# Patient Record
Sex: Female | Born: 1985 | Race: Black or African American | Hispanic: No | Marital: Married | Smoking: Never smoker
Health system: Southern US, Community
[De-identification: ages and names within clinical notes are randomized; demographics above are authoritative.]

---

## 2017-03-08 ENCOUNTER — Encounter (HOSPITAL_COMMUNITY): Payer: Self-pay

## 2017-03-08 ENCOUNTER — Emergency Department (HOSPITAL_COMMUNITY): Payer: BLUE CROSS/BLUE SHIELD

## 2017-03-08 ENCOUNTER — Emergency Department (HOSPITAL_COMMUNITY)
Admission: EM | Admit: 2017-03-08 | Discharge: 2017-03-09 | Disposition: A | Discharge status: Home / Self Care | Payer: BLUE CROSS/BLUE SHIELD | Attending: Emergency Medicine | Admitting: Emergency Medicine

## 2017-03-08 DIAGNOSIS — R079 Chest pain, unspecified: Secondary | ICD-10-CM | POA: Diagnosis present

## 2017-03-08 DIAGNOSIS — R0789 Other chest pain: Secondary | ICD-10-CM | POA: Diagnosis not present

## 2017-03-08 LAB — CBC
HCT: 36.5 % (ref 36.0–46.0)
HEMOGLOBIN: 11.8 g/dL — AB (ref 12.0–15.0)
MCH: 27.1 pg (ref 26.0–34.0)
MCHC: 32.3 g/dL (ref 30.0–36.0)
MCV: 83.7 fL (ref 78.0–100.0)
PLATELETS: 211 10*3/uL (ref 150–400)
RBC: 4.36 MIL/uL (ref 3.87–5.11)
RDW: 13.9 % (ref 11.5–15.5)
WBC: 3.9 10*3/uL — ABNORMAL LOW (ref 4.0–10.5)

## 2017-03-08 LAB — BASIC METABOLIC PANEL
Anion gap: 8 (ref 5–15)
BUN: 8 mg/dL (ref 6–20)
CALCIUM: 9.1 mg/dL (ref 8.9–10.3)
CHLORIDE: 105 mmol/L (ref 101–111)
CO2: 21 mmol/L — AB (ref 22–32)
CREATININE: 0.7 mg/dL (ref 0.44–1.00)
GFR calc non Af Amer: >60 mL/min (ref >60)
GLUCOSE: 94 mg/dL (ref 65–99)
Potassium: 3.5 mmol/L (ref 3.5–5.1)
Sodium: 134 mmol/L — ABNORMAL LOW (ref 135–145)

## 2017-03-08 LAB — I-STAT BETA HCG BLOOD, ED (MC, WL, AP ONLY)

## 2017-03-08 LAB — I-STAT TROPONIN, ED
TROPONIN I, POC: 0 ng/mL (ref 0.00–0.08)
Troponin i, poc: 0 ng/mL (ref 0.00–0.08)

## 2017-03-08 MED ORDER — GI COCKTAIL ~~LOC~~
30.0000 mL | Freq: Once | ORAL | Status: AC
  Administered 2017-03-08: 30 mL via ORAL
  Filled 2017-03-08: qty 30

## 2017-03-08 NOTE — ED Triage Notes (Signed)
patient complains of sharp chest pain that started this am when she awoke. Reports that the pain resolved than returned this afternoon. No shortness of breath. Denies cold symptoms. NAD

## 2017-03-08 NOTE — ED Provider Notes (Signed)
[facility]-EMERGENCY DEPT Provider Note   CSN: [id number] Arrival date & time: 03/08/17  1832     History   Chief Complaint Chief Complaint  Patient presents with  . Chest Pain    HPI Rose Ayala is a 31 y.o. female.  HPI   31 year old female, nonsmoker presenting for evaluation of chest pain. patient woke up this morning with pain to her mid sternal region which has been persistent, mildly improved during lunchtime but it has returned. She described pain as a sharp sensation, worse with positional change all with taking deep breath. Pain is currently 6 out of 10. No specific treatment tried. No associated fever, chills, lightheadedness, dizziness, nausea, vomiting, diarrhea, shortness of breath, productive cough, back pain, abdominal pain. No similar in family history of cardiac disease, no history of diabetes. No prior history of PE or DVT, no recent surgery, prolonged bed rest, unilateral leg swelling or calf pain.  History reviewed. No pertinent past medical history.  There are no active problems to display for this patient.   History reviewed. No pertinent surgical history.  OB History    No data available       Home Medications    Prior to Admission medications   Not on File    Family History No family history on file.  Social History Social History  Substance Use Topics  . Smoking status: Never Smoker  . Smokeless tobacco: Never Used  . Alcohol use Not on file     Allergies   Compazine [prochlorperazine edisylate]   Review of Systems Review of Systems  All other systems reviewed and are negative.    Physical Exam Updated Vital Signs BP 125/74   Pulse 82   Temp 98.6 F (37 C) (Oral)   Resp 16   LMP  (LMP Unknown)   SpO2 98%   Physical Exam  Constitutional: She appears well-developed and well-nourished. No distress.  HENT:  Head: Atraumatic.  Eyes: Conjunctivae are normal.  Neck: Neck supple.  Cardiovascular: Normal rate, regular rhythm  and intact distal pulses.   Pulmonary/Chest: Effort normal and breath sounds normal. No respiratory distress. She has no wheezes. She has no rales. She exhibits tenderness (tenderness to anterior chest on palpation without crepitus or emphysema.).  Abdominal: Soft. She exhibits no distension. There is no tenderness.  Musculoskeletal: She exhibits no edema.  Neurological: She is alert.  Skin: No rash noted.  Psychiatric: She has a normal mood and affect.  Nursing note and vitals reviewed.    ED Treatments / Results  Labs (all labs ordered are listed, but only abnormal results are displayed) Labs Reviewed  BASIC METABOLIC PANEL - Abnormal; Notable for the following:       Result Value   Sodium 134 (*)    CO2 21 (*)    All other components within normal limits  CBC - Abnormal; Notable for the following:    WBC 3.9 (*)    Hemoglobin 11.8 (*)    All other components within normal limits  I-STAT TROPONIN, ED  I-STAT TROPONIN, ED  I-STAT BETA HCG BLOOD, ED ([facility], [facility], [facility] ONLY)    EKG  EKG Interpretation  Date/Time:  Sunday March 08 2017 18:36:49 EDT Ventricular Rate:  88 PR Interval:  140 QRS Duration: 78 QT Interval:  356 QTC Calculation: 430 R Axis:   81 Text Interpretation:  Normal sinus rhythm with sinus arrhythmia Confirmed by Margarita Grizzle [phone]) on 03/08/2017 11:04:53 PM       Radiology Dg Chest  2 View  Result Date: 03/08/2017 CLINICAL DATA:  Right chest pain today. EXAM: CHEST  2 VIEW COMPARISON:  None. FINDINGS: The heart size and mediastinal contours are within normal limits. Both lungs are clear. The visualized skeletal structures are unremarkable. IMPRESSION: No active cardiopulmonary disease. Electronically Signed   By: Sherian ReinWei-Chen  Lin M.D.   On: 03/08/2017 19:10    Procedures Procedures (including critical care time)  Medications Ordered in ED Medications  gi cocktail (Maalox,Lidocaine,Donnatal) (30 mLs Oral Given 03/08/17 2339)     Initial Impression /  Assessment and Plan / ED Course  I have reviewed the triage vital signs and the nursing notes.  Pertinent labs & imaging results that were available during my care of the patient were reviewed by me and considered in my medical decision making (see chart for details).     BP 125/74   Pulse 82   Temp 98.6 F (37 C) (Oral)   Resp 16   LMP  (LMP Unknown)   SpO2 98%    Final Clinical Impressions(s) / ED Diagnoses   Final diagnoses:  Chest wall pain    New Prescriptions New Prescriptions   CYCLOBENZAPRINE (FLEXERIL) 10 MG TABLET    Take 1 tablet (10 mg total) by mouth 2 (two) times daily as needed for muscle spasms.   IBUPROFEN (ADVIL,MOTRIN) 600 MG TABLET    Take 1 tablet (600 mg total) by mouth every 6 (six) hours as needed.   11:28 PM Patient here with chest pain atypical for ACS. HEART score of 1, low risk of MACE.  PERC negative, doubt PE. Pain is reproducible on exam. She does eat spicy food therefore I will give GI cocktail. We'll recheck delta troponin  12:04 AM Negative delta troponin. GI cocktail provided no improvement. Suspect this is likely muscular skeletal. Will provide rice therapy, NSAIDs. Recommend outpatient follow-up with PCP. Return precaution discussed.   Fayrene Helperran, Remonia Otte, PA-C 03/09/17 0006    Gilda CreasePollina, Christopher J, MD 03/09/17 [phone]

## 2017-03-09 MED ORDER — IBUPROFEN 600 MG PO TABS: 600.0000 mg | ORAL_TABLET | Freq: Four times a day (QID) | ORAL | 0 refills | Status: AC | PRN

## 2017-03-09 MED ORDER — CYCLOBENZAPRINE HCL 10 MG PO TABS: 10.0000 mg | ORAL_TABLET | Freq: Two times a day (BID) | ORAL | 0 refills | Status: AC | PRN

## 2018-03-14 IMAGING — DX DG CHEST 2V
2 series · 2 of 2 positions shown · non-contrast
Comparison: None.

CLINICAL DATA: Right chest pain today.

EXAM:
CHEST  2 VIEW

[chest pa]
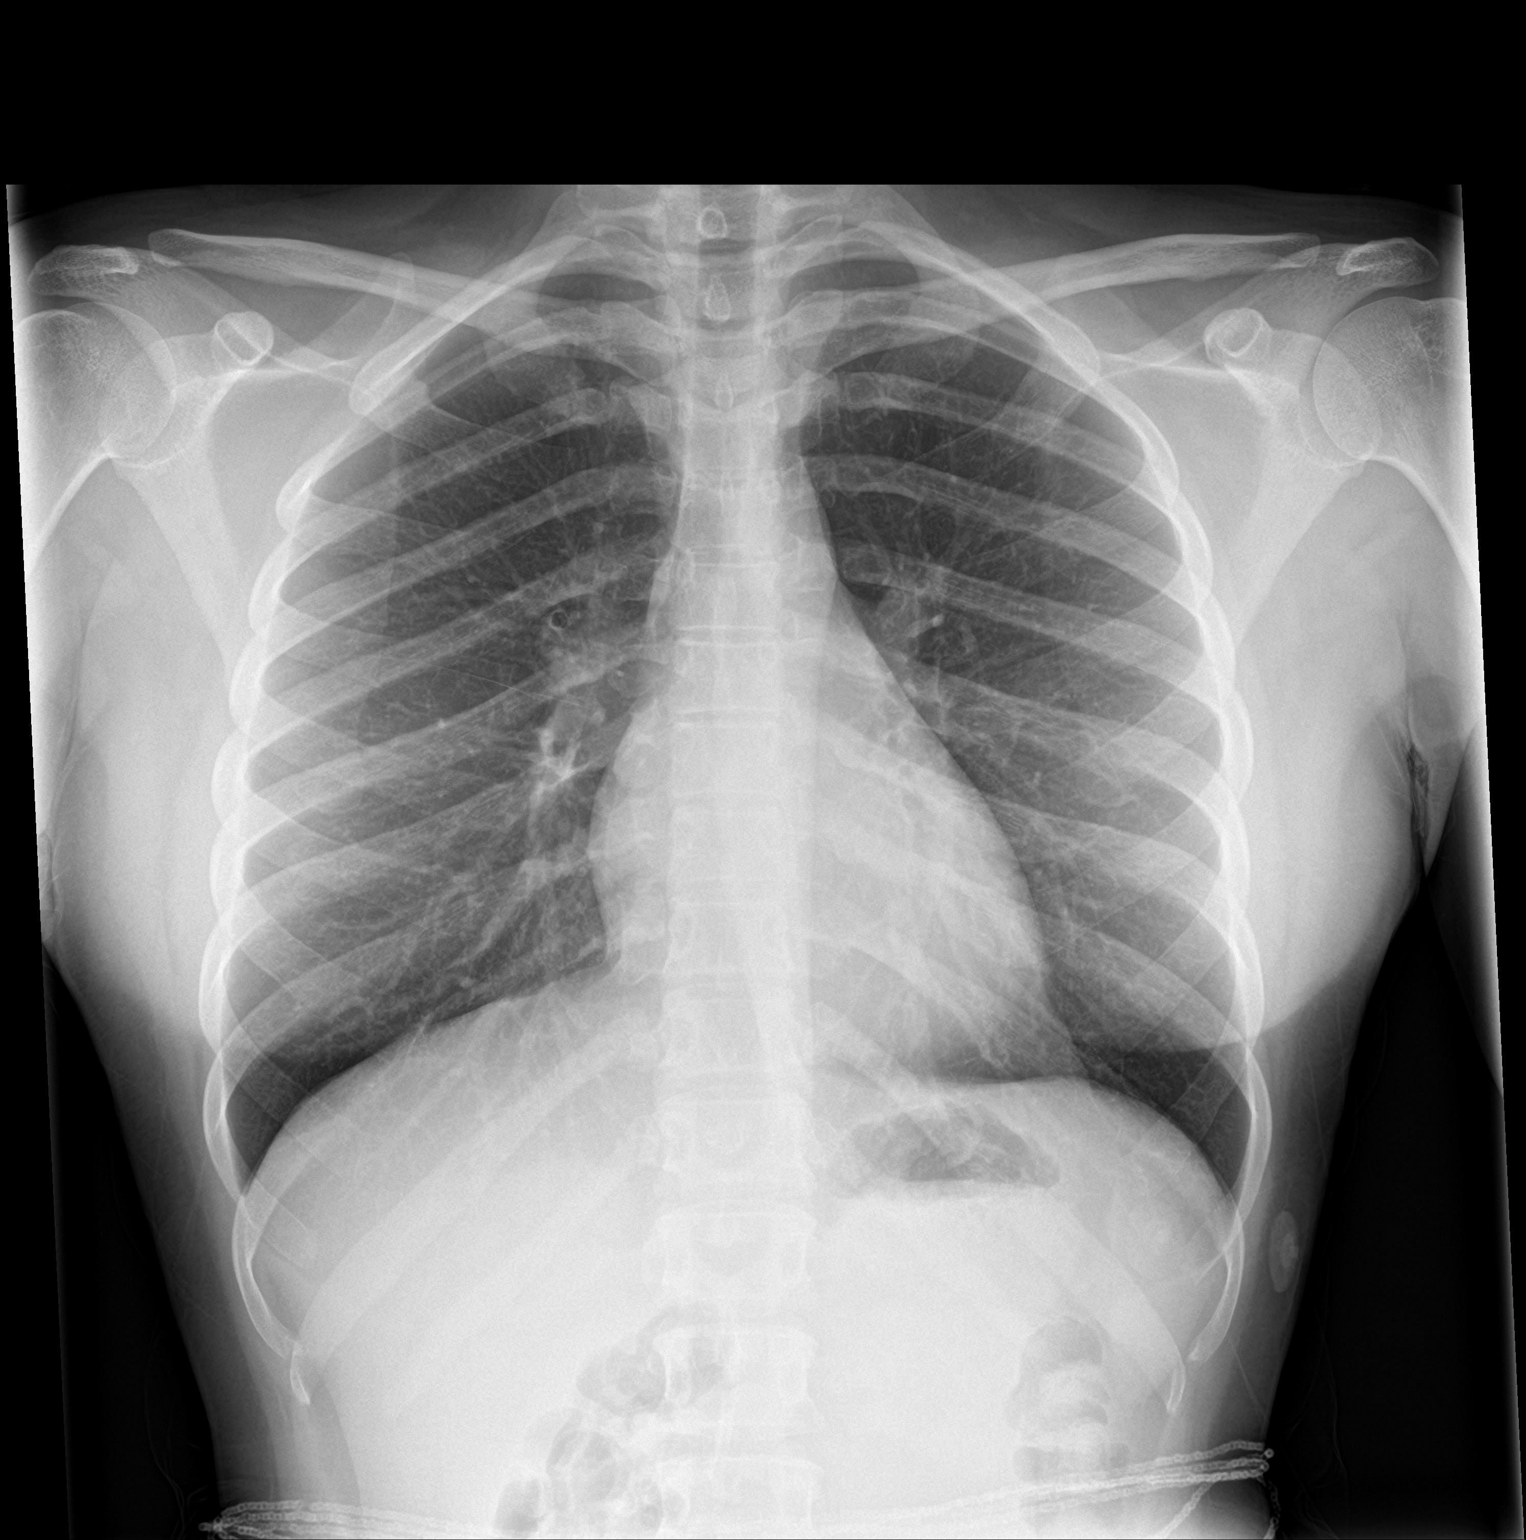

[chest lat]
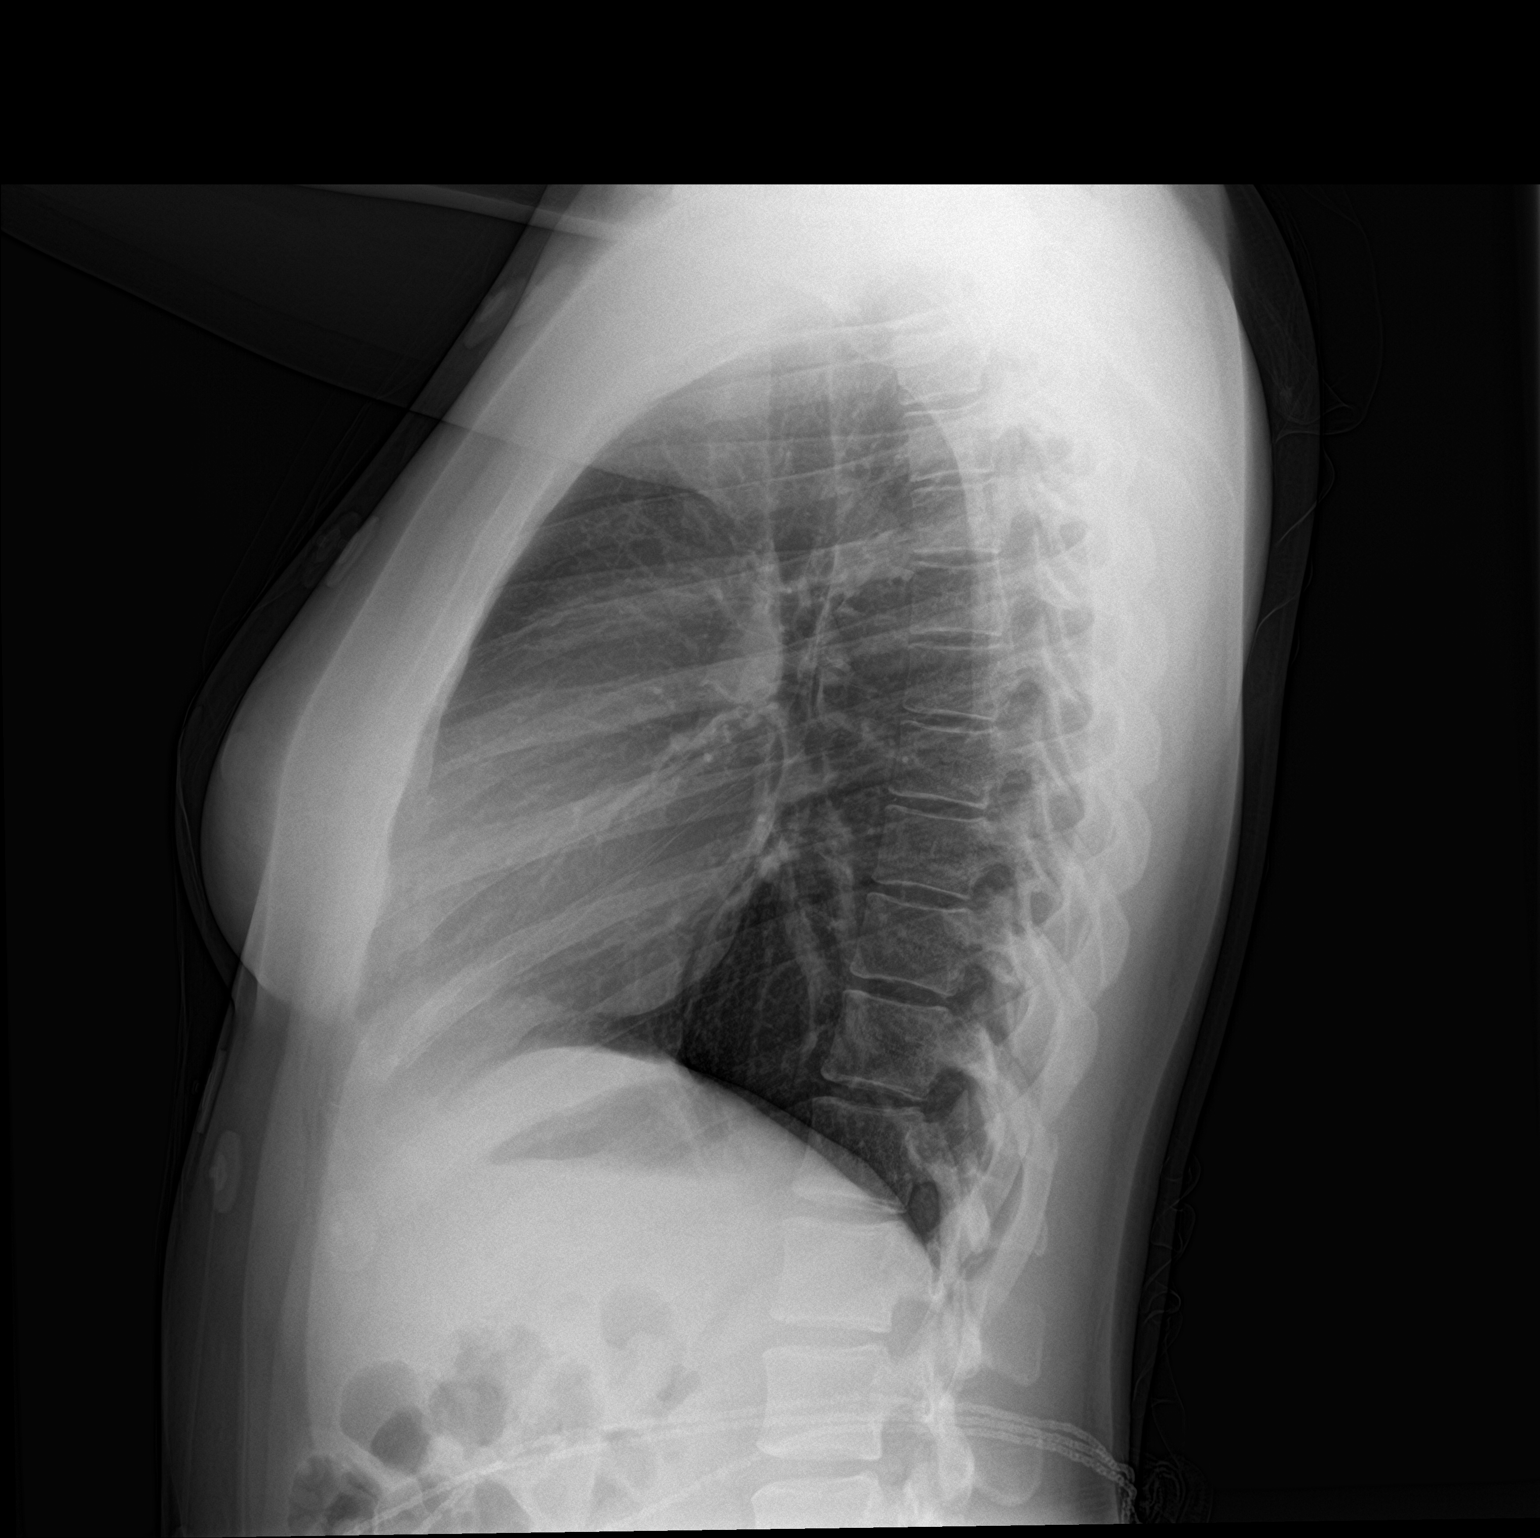

[2 of 2 positions shown; findings below may reference images not displayed]

FINDINGS: The heart size and mediastinal contours are within normal limits.
Both lungs are clear. The visualized skeletal structures are
unremarkable.
IMPRESSION: No active cardiopulmonary disease.
